# Patient Record
Sex: Female | Born: 1935 | Race: White | Hispanic: No | Smoking: Former smoker
Health system: Southern US, Community
[De-identification: ages and names within clinical notes are randomized; demographics above are authoritative.]

## PROBLEM LIST (undated history)

## (undated) DIAGNOSIS — E7800 Pure hypercholesterolemia, unspecified: Secondary | ICD-10-CM

## (undated) DIAGNOSIS — E039 Hypothyroidism, unspecified: Secondary | ICD-10-CM

## (undated) DIAGNOSIS — Q782 Osteopetrosis: Secondary | ICD-10-CM

## (undated) DIAGNOSIS — I7300 Raynaud's syndrome without gangrene: Secondary | ICD-10-CM

## (undated) DIAGNOSIS — T7840XA Allergy, unspecified, initial encounter: Secondary | ICD-10-CM

## (undated) DIAGNOSIS — D649 Anemia, unspecified: Secondary | ICD-10-CM

## (undated) DIAGNOSIS — E785 Hyperlipidemia, unspecified: Secondary | ICD-10-CM

## (undated) DIAGNOSIS — K219 Gastro-esophageal reflux disease without esophagitis: Secondary | ICD-10-CM

## (undated) DIAGNOSIS — Z8639 Personal history of other endocrine, nutritional and metabolic disease: Secondary | ICD-10-CM

## (undated) DIAGNOSIS — J449 Chronic obstructive pulmonary disease, unspecified: Secondary | ICD-10-CM

## (undated) DIAGNOSIS — I10 Essential (primary) hypertension: Secondary | ICD-10-CM

## (undated) DIAGNOSIS — M792 Neuralgia and neuritis, unspecified: Secondary | ICD-10-CM

## (undated) HISTORY — DX: Pure hypercholesterolemia, unspecified: E78.00

## (undated) HISTORY — DX: Hyperlipidemia, unspecified: E78.5

## (undated) HISTORY — DX: Hypothyroidism, unspecified: E03.9

## (undated) HISTORY — DX: Raynaud's syndrome without gangrene: I73.00

## (undated) HISTORY — DX: Gastro-esophageal reflux disease without esophagitis: K21.9

## (undated) HISTORY — PX: APPENDECTOMY: SHX54

## (undated) HISTORY — DX: Neuralgia and neuritis, unspecified: M79.2

## (undated) HISTORY — DX: Personal history of other endocrine, nutritional and metabolic disease: Z86.39

## (undated) HISTORY — DX: Essential (primary) hypertension: I10

## (undated) HISTORY — DX: Chronic obstructive pulmonary disease, unspecified: J44.9

## (undated) HISTORY — DX: Anemia, unspecified: D64.9

## (undated) HISTORY — DX: Allergy, unspecified, initial encounter: T78.40XA

## (undated) HISTORY — DX: Osteopetrosis: Q78.2

---

## 2011-07-04 LAB — LIPID PANEL
Cholesterol: 184 mg/dL (ref 0–200)
HDL: 64 mg/dL (ref 35–70)
LDL Cholesterol: 104 mg/dL
Triglycerides: 78 mg/dL (ref 40–160)

## 2011-07-04 LAB — CBC AND DIFFERENTIAL
HCT: 37 % (ref 36–46)
Hemoglobin: 12.6 g/dL (ref 12.0–16.0)
Platelets: 283 10*3/uL (ref 150–399)
WBC: 8.8 10^3/mL

## 2011-07-04 LAB — BASIC METABOLIC PANEL: BUN: 12 mg/dL (ref 4–21)

## 2011-07-04 LAB — HEPATIC FUNCTION PANEL: Alkaline Phosphatase: 55 U/L (ref 25–125)

## 2012-04-04 ENCOUNTER — Telehealth: Payer: Self-pay | Admitting: Internal Medicine

## 2012-04-04 MED ORDER — AMLODIPINE BESYLATE 2.5 MG PO TABS: 2.5000 mg | ORAL_TABLET | Freq: Every day | ORAL | Status: DC

## 2012-04-04 NOTE — Telephone Encounter (Signed)
rx sent to [facility] for amlodipine 2.5mg  (#30) with 3 refills

## 2012-04-20 ENCOUNTER — Other Ambulatory Visit: Payer: Self-pay | Admitting: *Deleted

## 2012-04-20 MED ORDER — FLUTICASONE PROPIONATE 50 MCG/ACT NA SUSP: 2.0000 | Freq: Every day | NASAL | Status: DC

## 2012-04-20 NOTE — Telephone Encounter (Signed)
Refilled flonase  

## 2012-04-27 ENCOUNTER — Telehealth: Payer: Self-pay | Admitting: Internal Medicine

## 2012-05-07 NOTE — Telephone Encounter (Signed)
Error

## 2012-06-12 ENCOUNTER — Encounter: Payer: Self-pay | Admitting: *Deleted

## 2012-06-15 ENCOUNTER — Ambulatory Visit: Payer: Self-pay | Admitting: Internal Medicine

## 2012-06-22 ENCOUNTER — Other Ambulatory Visit: Payer: Self-pay | Admitting: Internal Medicine

## 2012-06-22 NOTE — Telephone Encounter (Signed)
Eprescribed.

## 2012-07-10 ENCOUNTER — Other Ambulatory Visit: Payer: Self-pay | Admitting: Internal Medicine

## 2012-07-10 NOTE — Telephone Encounter (Signed)
Sent in to pharmacy.  

## 2012-07-22 ENCOUNTER — Other Ambulatory Visit: Payer: Self-pay | Admitting: *Deleted

## 2012-07-28 ENCOUNTER — Other Ambulatory Visit: Payer: Self-pay | Admitting: *Deleted

## 2012-07-29 ENCOUNTER — Encounter: Payer: Self-pay | Admitting: Internal Medicine

## 2012-07-29 MED ORDER — AMLODIPINE BESYLATE 2.5 MG PO TABS: ORAL_TABLET | ORAL | Status: DC

## 2012-07-29 NOTE — Telephone Encounter (Signed)
Sent in to pharmacy.  

## 2012-08-12 ENCOUNTER — Encounter: Payer: Self-pay | Admitting: Internal Medicine

## 2012-08-12 ENCOUNTER — Ambulatory Visit (INDEPENDENT_AMBULATORY_CARE_PROVIDER_SITE_OTHER): Payer: Medicare Other | Admitting: Internal Medicine

## 2012-08-12 VITALS — BP 140/70 | HR 67 | Temp 97.7°F | Ht 59.5 in | Wt 100.4 lb

## 2012-08-12 DIAGNOSIS — M810 Age-related osteoporosis without current pathological fracture: Secondary | ICD-10-CM | POA: Insufficient documentation

## 2012-08-12 DIAGNOSIS — E7800 Pure hypercholesterolemia, unspecified: Secondary | ICD-10-CM

## 2012-08-12 DIAGNOSIS — I10 Essential (primary) hypertension: Secondary | ICD-10-CM | POA: Insufficient documentation

## 2012-08-12 DIAGNOSIS — R350 Frequency of micturition: Secondary | ICD-10-CM

## 2012-08-12 DIAGNOSIS — E039 Hypothyroidism, unspecified: Secondary | ICD-10-CM

## 2012-08-12 DIAGNOSIS — R0989 Other specified symptoms and signs involving the circulatory and respiratory systems: Secondary | ICD-10-CM | POA: Insufficient documentation

## 2012-08-12 DIAGNOSIS — E871 Hypo-osmolality and hyponatremia: Secondary | ICD-10-CM

## 2012-08-12 DIAGNOSIS — D649 Anemia, unspecified: Secondary | ICD-10-CM

## 2012-08-12 LAB — URINALYSIS, ROUTINE W REFLEX MICROSCOPIC
Bilirubin Urine: NEGATIVE
Leukocytes, UA: NEGATIVE
Nitrite: NEGATIVE
Urobilinogen, UA: 0.2 (ref 0.0–1.0)
pH: 7.5 (ref 5.0–8.0)

## 2012-08-12 MED ORDER — ATORVASTATIN CALCIUM 20 MG PO TABS: 20.0000 mg | ORAL_TABLET | Freq: Every day | ORAL | Status: DC

## 2012-08-12 MED ORDER — LEVOTHYROXINE SODIUM 25 MCG PO TABS: 25.0000 ug | ORAL_TABLET | Freq: Every day | ORAL | Status: DC

## 2012-08-12 MED ORDER — AMLODIPINE BESYLATE 2.5 MG PO TABS: ORAL_TABLET | ORAL | Status: DC

## 2012-08-12 MED ORDER — FLUTICASONE PROPIONATE 50 MCG/ACT NA SUSP: 2.0000 | Freq: Every day | NASAL | Status: DC

## 2012-08-12 NOTE — Assessment & Plan Note (Signed)
Recheck sodium to confirm stable.  

## 2012-08-12 NOTE — Assessment & Plan Note (Signed)
Blood pressure as outlined.  States it runs in the 130s at home.  Same medication regimen.  Follow.  Check metabolic panel.

## 2012-08-12 NOTE — Progress Notes (Signed)
Subjective:    Patient ID: Bailey Sanchez, female    DOB: 1925-11-17, 79 y.o.   MRN: [id number]  HPI 79 year old female with past history of hypertension, hypercholesterolemia, hypothyroidism and SIADH who comes in today for a scheduled follow up and to transfer her care here to [facility].  States she has been doing relatively well.  No chest pain or tightness.  Tries to stay active.  No nausea or vomiting.  No bowel change.  Had a bladder infection in the fall 2013.  Took AZO.  Symptoms improved.  States since that time has noticed some increased urinary frequency and nocturia.  Blood pressure has been doing well at home.  States averaging 130s systolic.  Weight is stable.  Bowels stable.    Past Medical History  Diagnosis Date  . Hyperlipidemia   . Neuralgia and neuritis   . Hypertension   . Anemia   . GERD (gastroesophageal reflux disease)   . Hypercholesterolemia   . Allergy   . Raynauds phenomenon     documented hx  . Osteopetrosis   . COPD (chronic obstructive pulmonary disease)     documented hx of mild case  . History of SIADH   . Hypothyroidism     Current Outpatient Prescriptions on File Prior to Visit  Medication Sig Dispense Refill  . Ascorbic Acid (VITAMIN C) 1000 MG tablet Take 1,000 mg by mouth daily.      Marland Kitchen aspirin EC 81 MG tablet Take 81 mg by mouth daily.      . Calcium-Vitamin D (CALTRATE 600 PLUS-VIT D PO) Take 1 tablet by mouth daily.      . cholecalciferol (VITAMIN D) 400 UNITS TABS Take 400 Units by mouth daily.      . cyanocobalamin 1000 MCG tablet Take 1,000 mcg by mouth daily.      . Multiple Vitamins-Minerals (CENTRUM SILVER PO) Take by mouth.      . vitamin E (VITAMIN E) 400 UNIT capsule Take 400 Units by mouth daily.       No current facility-administered medications on file prior to visit.    Review of Systems Patient denies any headache, lightheadedness or dizziness.  No sinus or allergy symptoms.  No chest pain, tightness or palpitations.  No  increased shortness of breath, cough or congestion.  No nausea or vomiting.  No acid reflux.  No abdominal pain or cramping.  No bowel change, such as diarrhea, constipation, BRBPR or melana.  Previous urinary tract infection as outlined.  Since has had increased urinary frequency and nocturia.        Objective:   Physical Exam Filed Vitals:   08/12/12 1006  BP: 140/70  Pulse: 67  Temp: 97.7 F (33.57 C)   80 year old female in no acute distress.   HEENT:  Nares- clear.  Oropharynx - without lesions. NECK:  Supple.  Nontender.  No audible bruit.  HEART:  Appears to be regular. LUNGS:  No crackles or wheezing audible.  Respirations even and unlabored.  RADIAL PULSE:  Equal bilaterally.   ABDOMEN:  Soft, nontender.  Bowel sounds present and normal.  Audible abdominal bruit.    EXTREMITIES:  No increased edema present.  DP pulses palpable and equal bilaterally.           Assessment & Plan:  PULMONARY.  Breathing stable.  Follow.   GI.  States she is eating well.  Weight is stable at 100 pounds.  Previous abdominal ultrasound 09/20/08- no acute abnormality.  Common bile duct - slightly prominent but they felt it was normal for her age.  Have previously talked with her regarding further w/up.  She has declined.  Declines colon evaluation.    HEALTH MAINTENANCE.  Physical 10/09/11.  She declines further GI evaluation.  Mammogram 07/20/10 - BiRADS II.  She has requested not to schedule any more mammograms.

## 2012-08-12 NOTE — Assessment & Plan Note (Signed)
On thyroid replacement.  Check tsh.  

## 2012-08-12 NOTE — Assessment & Plan Note (Signed)
Last bone density 12/04/10 revealed osteoporosis with no significant change.  Off Fosamax.  Last vitamin D level wnl.  Continue calcium, vitamin D and weight bearing exercise.     

## 2012-08-12 NOTE — Assessment & Plan Note (Signed)
Remains on Lipitor.  Check lipid panel and liver function with next fasting labs.    

## 2012-08-12 NOTE — Assessment & Plan Note (Signed)
Check cbc to confirm stable.   

## 2012-08-12 NOTE — Assessment & Plan Note (Signed)
Previous ultrasound revealed to aorta to have atherosclerotic irregularity.  No mention of aneurysm.  Has declined further w/up.  Follow.   

## 2012-08-14 LAB — CULTURE, URINE COMPREHENSIVE
Colony Count: NO GROWTH
Organism ID, Bacteria: NO GROWTH

## 2012-10-14 ENCOUNTER — Encounter: Payer: Self-pay | Admitting: Internal Medicine

## 2012-10-14 ENCOUNTER — Ambulatory Visit (INDEPENDENT_AMBULATORY_CARE_PROVIDER_SITE_OTHER): Payer: Medicare Other | Admitting: Internal Medicine

## 2012-10-14 VITALS — BP 120/70 | HR 65 | Temp 97.5°F | Ht 60.0 in | Wt 99.8 lb

## 2012-10-14 DIAGNOSIS — R0989 Other specified symptoms and signs involving the circulatory and respiratory systems: Secondary | ICD-10-CM

## 2012-10-14 DIAGNOSIS — E039 Hypothyroidism, unspecified: Secondary | ICD-10-CM

## 2012-10-14 DIAGNOSIS — I10 Essential (primary) hypertension: Secondary | ICD-10-CM

## 2012-10-14 DIAGNOSIS — M810 Age-related osteoporosis without current pathological fracture: Secondary | ICD-10-CM

## 2012-10-14 DIAGNOSIS — E871 Hypo-osmolality and hyponatremia: Secondary | ICD-10-CM

## 2012-10-14 DIAGNOSIS — D649 Anemia, unspecified: Secondary | ICD-10-CM

## 2012-10-14 DIAGNOSIS — R350 Frequency of micturition: Secondary | ICD-10-CM

## 2012-10-14 DIAGNOSIS — E7800 Pure hypercholesterolemia, unspecified: Secondary | ICD-10-CM

## 2012-10-18 ENCOUNTER — Encounter: Payer: Self-pay | Admitting: Internal Medicine

## 2012-10-18 NOTE — Assessment & Plan Note (Signed)
Follow sodium to confirm stable.  

## 2012-10-18 NOTE — Assessment & Plan Note (Signed)
On thyroid replacement.  Follow tsh.  

## 2012-10-18 NOTE — Assessment & Plan Note (Signed)
Last bone density 12/04/10 revealed osteoporosis with no significant change.  Off Fosamax.  Last vitamin D level wnl.  Continue calcium, vitamin D and weight bearing exercise.

## 2012-10-18 NOTE — Assessment & Plan Note (Signed)
Follow cbc to confirm stable.  

## 2012-10-18 NOTE — Progress Notes (Signed)
Subjective:    Patient ID: Bailey Sanchez, female    DOB: [birth year], 79 y.o.   MRN: [id number]  HPI 79 year old female with past history of hypertension, hypercholesterolemia, hypothyroidism and SIADH who comes in today to follow up on these issues as well as for a complete physical exam. States she has been doing relatively well.  No chest pain or tightness.  Tries to stay active.  No nausea or vomiting.  No bowel change.  Blood pressure has been doing well at home.  Weight is relatively stable.  Bowels stable.  Some increased urinary frequency.     Past Medical History  Diagnosis Date  . Hyperlipidemia   . Neuralgia and neuritis   . Hypertension   . Anemia   . GERD (gastroesophageal reflux disease)   . Hypercholesterolemia   . Allergy   . Raynauds phenomenon     documented hx  . Osteopetrosis   . COPD (chronic obstructive pulmonary disease)     documented hx of mild case  . History of SIADH   . Hypothyroidism     Current Outpatient Prescriptions on File Prior to Visit  Medication Sig Dispense Refill  . amLODipine (NORVASC) 2.5 MG tablet TAKE ONE (1) TABLET EACH DAY  90 tablet  1  . Ascorbic Acid (VITAMIN C) 1000 MG tablet Take 1,000 mg by mouth daily.      Marland Kitchen aspirin EC 81 MG tablet Take 81 mg by mouth daily.      Marland Kitchen atorvastatin (LIPITOR) 20 MG tablet Take 1 tablet (20 mg total) by mouth daily.  90 tablet  1  . Calcium-Vitamin D (CALTRATE 600 PLUS-VIT D PO) Take 1 tablet by mouth daily.      . cholecalciferol (VITAMIN D) 400 UNITS TABS Take 400 Units by mouth daily.      . cyanocobalamin 1000 MCG tablet Take 1,000 mcg by mouth daily.      . fluticasone (FLONASE) 50 MCG/ACT nasal spray Place 2 sprays into the nose daily.  16 g  6  . levothyroxine (SYNTHROID, LEVOTHROID) 25 MCG tablet Take 1 tablet (25 mcg total) by mouth daily.  90 tablet  1  . Multiple Vitamins-Minerals (CENTRUM SILVER PO) Take by mouth.      . vitamin E (VITAMIN E) 400 UNIT capsule Take 400 Units by mouth  daily.       No current facility-administered medications on file prior to visit.    Review of Systems Patient denies any headache, lightheadedness or dizziness.  No sinus or allergy symptoms.  No chest pain, tightness or palpitations.  No increased shortness of breath, cough or congestion.  No nausea or vomiting.  No acid reflux.  No abdominal pain or cramping.  No bowel change, such as diarrhea, constipation, BRBPR or melana.  Since has had increased urinary frequency and nocturia. Discussed treatment options.  She desires no medication at this time.  Discussed not drinking before bed, emptying bladder regularly, etc.  She feels she is not emptying her bladder fully.         Objective:   Physical Exam  Filed Vitals:   10/14/12 0945  BP: 120/70  Pulse: 65  Temp: 97.5 F (43.70 C)   79 year old female in no acute distress.   HEENT:  Nares- clear.  Oropharynx - without lesions. NECK:  Supple.  Nontender.  No audible bruit.  HEART:  Appears to be regular. LUNGS:  No crackles or wheezing audible.  Respirations even and unlabored.  RADIAL  PULSE:  Equal bilaterally.    BREASTS:  No nipple discharge or nipple retraction present.  Could not appreciate any distinct nodules or axillary adenopathy.  ABDOMEN:  Soft, nontender.  Bowel sounds present and normal.  No audible abdominal bruit.  GU:  She declined.    RECTAL:  She declined.   EXTREMITIES:  No increased edema present.  DP pulses palpable and equal bilaterally.          Assessment & Plan:  GU.  She feels she is not emptying her bladder fully.  Discussed treatment options with her.  After discussion, it was decided to refer her to urology for evaluation and further treatment.    PULMONARY.  Breathing stable.  Follow.   GI.  States she is eating well.  Weight is stable at 100 pounds.  Previous abdominal ultrasound 09/20/08- no acute abnormality.  Common bile duct - slightly prominent but they felt it was normal for her age.  Have  previously talked with her regarding further w/up.  She has declined.  Declines colon evaluation.    HEALTH MAINTENANCE.  Physical today.  Declined rectal and pelvic exam.  She declines further GI evaluation.  Mammogram 07/20/10 - BiRADS II.  She has requested not to schedule any more mammograms.

## 2012-10-18 NOTE — Assessment & Plan Note (Signed)
Blood pressure as outlined.  Same medication regimen.  Follow.   

## 2012-10-18 NOTE — Assessment & Plan Note (Signed)
Remains on Lipitor.  Check lipid panel and liver function with next fasting labs.

## 2012-10-18 NOTE — Assessment & Plan Note (Signed)
Previous ultrasound revealed to aorta to have atherosclerotic irregularity.  No mention of aneurysm.  Has declined further w/up.  Follow.

## 2012-10-23 ENCOUNTER — Other Ambulatory Visit (INDEPENDENT_AMBULATORY_CARE_PROVIDER_SITE_OTHER): Payer: Medicare Other

## 2012-10-23 DIAGNOSIS — E039 Hypothyroidism, unspecified: Secondary | ICD-10-CM

## 2012-10-23 DIAGNOSIS — I10 Essential (primary) hypertension: Secondary | ICD-10-CM

## 2012-10-23 DIAGNOSIS — D649 Anemia, unspecified: Secondary | ICD-10-CM

## 2012-10-23 DIAGNOSIS — E7800 Pure hypercholesterolemia, unspecified: Secondary | ICD-10-CM

## 2012-10-23 LAB — CBC WITH DIFFERENTIAL/PLATELET
Basophils Absolute: 0.1 10*3/uL (ref 0.0–0.1)
Eosinophils Absolute: 0.4 10*3/uL (ref 0.0–0.7)
HCT: 37.9 % (ref 36.0–46.0)
Hemoglobin: 12.8 g/dL (ref 12.0–15.0)
Lymphs Abs: 4.1 10*3/uL — ABNORMAL HIGH (ref 0.7–4.0)
MCHC: 33.7 g/dL (ref 30.0–36.0)
MCV: 90.2 fl (ref 78.0–100.0)
Monocytes Absolute: 0.6 10*3/uL (ref 0.1–1.0)
Monocytes Relative: 6.9 % (ref 3.0–12.0)
Neutro Abs: 3.1 10*3/uL (ref 1.4–7.7)
Platelets: 221 10*3/uL (ref 150.0–400.0)
RDW: 13.7 % (ref 11.5–14.6)

## 2012-10-23 LAB — TSH: TSH: 2.82 u[IU]/mL (ref 0.35–5.50)

## 2012-10-23 LAB — HEPATIC FUNCTION PANEL
ALT: 23 U/L (ref 0–35)
AST: 30 U/L (ref 0–37)
Albumin: 3.8 g/dL (ref 3.5–5.2)
Alkaline Phosphatase: 48 U/L (ref 39–117)
Total Protein: 6.4 g/dL (ref 6.0–8.3)

## 2012-10-23 LAB — LIPID PANEL: HDL: 92.6 mg/dL (ref >39.00)

## 2012-10-23 LAB — BASIC METABOLIC PANEL
Chloride: 107 mEq/L (ref 96–112)
GFR: 60.09 mL/min (ref >60.00)
Glucose, Bld: 85 mg/dL (ref 70–99)
Potassium: 4.9 mEq/L (ref 3.5–5.1)
Sodium: 141 mEq/L (ref 135–145)

## 2012-10-23 LAB — LDL CHOLESTEROL, DIRECT: Direct LDL: 94.7 mg/dL

## 2012-10-26 ENCOUNTER — Encounter: Payer: Self-pay | Admitting: *Deleted

## 2013-02-17 ENCOUNTER — Ambulatory Visit (INDEPENDENT_AMBULATORY_CARE_PROVIDER_SITE_OTHER): Payer: Medicare Other

## 2013-02-17 DIAGNOSIS — Z23 Encounter for immunization: Secondary | ICD-10-CM

## 2013-02-18 ENCOUNTER — Other Ambulatory Visit: Payer: Self-pay | Admitting: Internal Medicine

## 2013-03-04 ENCOUNTER — Other Ambulatory Visit: Payer: Self-pay | Admitting: Internal Medicine

## 2013-04-19 ENCOUNTER — Ambulatory Visit (INDEPENDENT_AMBULATORY_CARE_PROVIDER_SITE_OTHER): Payer: Medicare Other | Admitting: Internal Medicine

## 2013-04-19 ENCOUNTER — Encounter: Payer: Self-pay | Admitting: Internal Medicine

## 2013-04-19 ENCOUNTER — Encounter (INDEPENDENT_AMBULATORY_CARE_PROVIDER_SITE_OTHER): Payer: Self-pay

## 2013-04-19 VITALS — BP 110/70 | HR 62 | Temp 97.6°F | Ht 60.0 in | Wt 101.5 lb

## 2013-04-19 DIAGNOSIS — M810 Age-related osteoporosis without current pathological fracture: Secondary | ICD-10-CM

## 2013-04-19 DIAGNOSIS — E7800 Pure hypercholesterolemia, unspecified: Secondary | ICD-10-CM

## 2013-04-19 DIAGNOSIS — E871 Hypo-osmolality and hyponatremia: Secondary | ICD-10-CM

## 2013-04-19 DIAGNOSIS — D649 Anemia, unspecified: Secondary | ICD-10-CM

## 2013-04-19 DIAGNOSIS — R0989 Other specified symptoms and signs involving the circulatory and respiratory systems: Secondary | ICD-10-CM

## 2013-04-19 DIAGNOSIS — E039 Hypothyroidism, unspecified: Secondary | ICD-10-CM

## 2013-04-19 DIAGNOSIS — I10 Essential (primary) hypertension: Secondary | ICD-10-CM

## 2013-04-19 NOTE — Assessment & Plan Note (Signed)
Follow sodium to confirm stable.  

## 2013-04-19 NOTE — Assessment & Plan Note (Signed)
On thyroid replacement.  Follow tsh.  

## 2013-04-19 NOTE — Assessment & Plan Note (Addendum)
Last bone density 12/04/10 revealed osteoporosis with no significant change.  Off Fosamax.  Last vitamin D level wnl.  Continue vitamin D and weight bearing exercise.

## 2013-04-19 NOTE — Progress Notes (Signed)
Subjective:    Patient ID: Bailey Sanchez, female    DOB: Jun 23, 1925, 79 y.o.   MRN: [id number]  HPI 79 year old female with past history of hypertension, hypercholesterolemia, hypothyroidism and SIADH who comes in today for a scheduled follow up.   States she has been doing relatively well.  No chest pain or tightness.  Tries to stay active.  No nausea or vomiting.  No bowel change.  Blood pressure has been doing well at home.  Weight is stable.  Bowels stable.  Some increased urinary frequency.  Saw Michiel Cowboy.  On Myrbetriq.  Helping some.  Planning to f/u with urology.  Some right shoulder pain.  Had an injection.  Helped some.  Takes tylenol.  Helps.     Past Medical History  Diagnosis Date  . Hyperlipidemia   . Neuralgia and neuritis   . Hypertension   . Anemia   . GERD (gastroesophageal reflux disease)   . Hypercholesterolemia   . Allergy   . Raynauds phenomenon     documented hx  . Osteopetrosis   . COPD (chronic obstructive pulmonary disease)     documented hx of mild case  . History of SIADH   . Hypothyroidism     Current Outpatient Prescriptions on File Prior to Visit  Medication Sig Dispense Refill  . amLODipine (NORVASC) 2.5 MG tablet TAKE ONE (1) TABLET EACH DAY  90 tablet  1  . Ascorbic Acid (VITAMIN C) 1000 MG tablet Take 1,000 mg by mouth daily.      Marland Kitchen aspirin EC 81 MG tablet Take 81 mg by mouth daily.      Marland Kitchen atorvastatin (LIPITOR) 20 MG tablet TAKE ONE (1) TABLET EACH DAY  90 tablet  1  . Calcium-Vitamin D (CALTRATE 600 PLUS-VIT D PO) Take 1 tablet by mouth daily.      . cholecalciferol (VITAMIN D) 400 UNITS TABS Take 400 Units by mouth daily.      . cyanocobalamin 1000 MCG tablet Take 1,000 mcg by mouth daily.      . fluticasone (FLONASE) 50 MCG/ACT nasal spray Place 2 sprays into the nose daily.  16 g  6  . levothyroxine (SYNTHROID, LEVOTHROID) 25 MCG tablet TAKE ONE (1) TABLET EACH DAY  90 tablet  2  . Multiple Vitamins-Minerals (CENTRUM SILVER PO) Take by  mouth.      . Multiple Vitamins-Minerals (PRESERVISION/LUTEIN) CAPS Take by mouth 2 (two) times daily.      . vitamin E (VITAMIN E) 400 UNIT capsule Take 400 Units by mouth daily.       No current facility-administered medications on file prior to visit.    Review of Systems Patient denies any headache, lightheadedness or dizziness.  No sinus or allergy symptoms.  No chest pain, tightness or palpitations.  No increased shortness of breath, cough or congestion.  No nausea or vomiting.  No acid reflux.  No abdominal pain or cramping.  No bowel change, such as diarrhea, constipation, BRBPR or melana.  On Myrbetriq now.  Helping her bladder symptoms some.  Continues to f/u with urology.  Had shoulder injection.  Helped some.  Takes tylenol.  Helps.          Objective:   Physical Exam  Filed Vitals:   04/19/13 0751  BP: 110/70  Pulse: 62  Temp: 97.6 F (76.33 C)   79 year old female in no acute distress.   HEENT:  Nares- clear.  Oropharynx - without lesions. NECK:  Supple.  Nontender.  No audible bruit.  HEART:  Appears to be regular. LUNGS:  No crackles or wheezing audible.  Respirations even and unlabored.  RADIAL PULSE:  Equal bilaterally.  ABDOMEN:  Soft, nontender.  Bowel sounds present and normal.  No audible abdominal bruit. Marland Kitchen   EXTREMITIES:  No increased edema present.  DP pulses palpable and equal bilaterally.   MSK:  Some minimal discomfort with full extension right arm.          Assessment & Plan:  PULMONARY.  Breathing stable.  Follow.   GI.  States she is eating well.  Weight is stable at 101 pounds.  Previous abdominal ultrasound 09/20/08- no acute abnormality.  Common bile duct - slightly prominent but they felt it was normal for her age.  Have previously talked with her regarding further w/up.  She has declined.  Declines colon evaluation.    HEALTH MAINTENANCE.  Physical 10/14/12.   Declined rectal and pelvic exam.  She declines further GI evaluation.  Mammogram 07/20/10 -  BiRADS II.  She has requested not to schedule any more mammograms.

## 2013-04-19 NOTE — Progress Notes (Signed)
Pre-visit discussion using our clinic review tool. No additional management support is needed unless otherwise documented below in the visit note.  

## 2013-04-20 ENCOUNTER — Encounter: Payer: Self-pay | Admitting: Internal Medicine

## 2013-04-20 NOTE — Assessment & Plan Note (Signed)
Previous ultrasound revealed to aorta to have atherosclerotic irregularity.  No mention of aneurysm.  Has declined further w/up.  Follow.

## 2013-04-20 NOTE — Assessment & Plan Note (Signed)
Follow cbc to confirm stable.  

## 2013-04-20 NOTE — Assessment & Plan Note (Signed)
Remains on Lipitor.  Check lipid panel and liver function with next fasting labs.

## 2013-04-20 NOTE — Assessment & Plan Note (Signed)
Blood pressure as outlined.  Same medication regimen.  Follow.   

## 2013-07-27 ENCOUNTER — Telehealth: Payer: Self-pay | Admitting: Internal Medicine

## 2013-07-27 NOTE — Telephone Encounter (Signed)
Called pt back & offered her the 8:30 appt tomorrow-pt accepted it

## 2013-07-27 NOTE — Telephone Encounter (Signed)
Pt states that sx's started yesterday. Still dizzy & nauseated, but not vomiting anymore. Feels like she has a fever, & she has some body aches. Hasn't been around anyone sick that she knows of. Doing okay keeping fluids down. She is weak & doesn't want to leave the house. Pt would like to have something called in to pharmacy. Please advise

## 2013-07-27 NOTE — Telephone Encounter (Signed)
If acute dizziness and nausea - then needs evaluation.  I would recommend evaluation today.

## 2013-07-27 NOTE — Telephone Encounter (Signed)
Pt.notified

## 2013-07-27 NOTE — Telephone Encounter (Signed)
Cumi/Patient  DOB: Jan 13, 1926 PCP Einar Pheasant.  80 yo called regarding vomiting with dizziness.  No contact at time of call back approximately 1316.  Left message to call if assistance still needed.

## 2013-07-28 ENCOUNTER — Encounter (INDEPENDENT_AMBULATORY_CARE_PROVIDER_SITE_OTHER): Payer: Self-pay

## 2013-07-28 ENCOUNTER — Other Ambulatory Visit: Payer: Self-pay | Admitting: Emergency Medicine

## 2013-07-28 ENCOUNTER — Ambulatory Visit (INDEPENDENT_AMBULATORY_CARE_PROVIDER_SITE_OTHER): Payer: Medicare Other | Admitting: Internal Medicine

## 2013-07-28 ENCOUNTER — Encounter: Payer: Self-pay | Admitting: Internal Medicine

## 2013-07-28 VITALS — BP 110/80 | HR 55 | Temp 97.6°F | Ht 60.0 in | Wt 99.5 lb

## 2013-07-28 DIAGNOSIS — I10 Essential (primary) hypertension: Secondary | ICD-10-CM

## 2013-07-28 DIAGNOSIS — E871 Hypo-osmolality and hyponatremia: Secondary | ICD-10-CM

## 2013-07-28 DIAGNOSIS — R42 Dizziness and giddiness: Secondary | ICD-10-CM

## 2013-07-28 DIAGNOSIS — D649 Anemia, unspecified: Secondary | ICD-10-CM

## 2013-07-28 NOTE — Progress Notes (Signed)
Pre-visit discussion using our clinic review tool. No additional management support is needed unless otherwise documented below in the visit note.  

## 2013-08-01 ENCOUNTER — Encounter: Payer: Self-pay | Admitting: Internal Medicine

## 2013-08-01 DIAGNOSIS — R42 Dizziness and giddiness: Secondary | ICD-10-CM | POA: Insufficient documentation

## 2013-08-01 NOTE — Progress Notes (Signed)
Subjective:    Patient ID: Bailey Sanchez, female    DOB: [birth year], 80 y.o.   MRN: [id number]  Dizziness Associated symptoms include coughing.  Cough  80 year old female with past history of hypertension, hypercholesterolemia, hypothyroidism and SIADH who comes in today as a work in with concerns regarding weakness and dizziness.  States symptoms started approximately five days ago.  Felt dizzy.  Worse the following day.  Has had minimal cough.  Some emesis.  Feels sick after eating.  No diarrhea.  No chest pain or tightness.  Just feels very weak.  Symptoms have persisted, so in today for evaluation.     Past Medical History  Diagnosis Date  . Hyperlipidemia   . Neuralgia and neuritis   . Hypertension   . Anemia   . GERD (gastroesophageal reflux disease)   . Hypercholesterolemia   . Allergy   . Raynauds phenomenon     documented hx  . Osteopetrosis   . COPD (chronic obstructive pulmonary disease)     documented hx of mild case  . History of SIADH   . Hypothyroidism     Current Outpatient Prescriptions on File Prior to Visit  Medication Sig Dispense Refill  . amLODipine (NORVASC) 2.5 MG tablet TAKE ONE (1) TABLET EACH DAY  90 tablet  1  . Ascorbic Acid (VITAMIN C) 1000 MG tablet Take 1,000 mg by mouth daily.      Marland Kitchen aspirin EC 81 MG tablet Take 81 mg by mouth daily.      Marland Kitchen atorvastatin (LIPITOR) 20 MG tablet TAKE ONE (1) TABLET EACH DAY  90 tablet  1  . Calcium-Vitamin D (CALTRATE 600 PLUS-VIT D PO) Take 1 tablet by mouth daily.      . cholecalciferol (VITAMIN D) 400 UNITS TABS Take 400 Units by mouth daily.      . cyanocobalamin 1000 MCG tablet Take 1,000 mcg by mouth daily.      . fluticasone (FLONASE) 50 MCG/ACT nasal spray Place 2 sprays into the nose daily.  16 g  6  . levothyroxine (SYNTHROID, LEVOTHROID) 25 MCG tablet TAKE ONE (1) TABLET EACH DAY  90 tablet  2  . mirabegron ER (MYRBETRIQ) 25 MG TB24 tablet Take 25 mg by mouth daily.      . Multiple Vitamins-Minerals  (CENTRUM SILVER PO) Take by mouth.      . Multiple Vitamins-Minerals (PRESERVISION/LUTEIN) CAPS Take by mouth 2 (two) times daily.      . vitamin E (VITAMIN E) 400 UNIT capsule Take 400 Units by mouth daily.       No current facility-administered medications on file prior to visit.    Review of Systems  Respiratory: Positive for cough.   Neurological: Positive for dizziness.  Patient denies any headache.  Dizziness as outlined.   No sinus or allergy symptoms.  No chest pain, tightness or palpitations.  No increased shortness of breath, cough or congestion.  No nausea or vomiting.  No acid reflux.  No abdominal pain or cramping.  No bowel change, such as diarrhea, constipation, BRBPR or melana.  Weakness as outlined.            Objective:   Physical Exam  Filed Vitals:   07/28/13 0830  BP: 110/80  Pulse: 55  Temp: 97.6 F (25.74 C)   80 year old female who appears not to feel well.     HEENT:  Nares- clear.  Oropharynx - without lesions. NECK:  Supple.  Nontender.  No audible bruit.  HEART:  Appears to be regular. LUNGS:  No crackles or wheezing audible.  Respirations even and unlabored.  RADIAL PULSE:  Equal bilaterally.  ABDOMEN:  Soft, nontender.  Bowel sounds present and normal.  No audible abdominal bruit. Marland Kitchen   EXTREMITIES:  No increased edema present.  DP pulses palpable and equal bilaterally.         Assessment & Plan:  PULMONARY.  Breathing stable.  Follow.   GI.  Previous abdominal ultrasound 09/20/08- no acute abnormality.  Common bile duct - slightly prominent but they felt it was normal for her age.  Have previously talked with her regarding further w/up.  She has declined.  Declines colon evaluation.  Now with emesis associated with the dizziness.  Decreased appetite.    HEALTH MAINTENANCE.  Physical 10/14/12.   Mammogram 07/20/10 - BiRADS II.  She has requested not to schedule any more mammograms.   I spent 25 minutes with the patient with more than 50% of the time spent  in consultation regarding the above.

## 2013-08-01 NOTE — Assessment & Plan Note (Signed)
Orthostatic on exam.  Blood pressure after minimal standing - 70s.  Weak.  Will need to send over to the ER for further evaluation and treatment.  Pt, her sister and her niece notified and in agreement for referral to ER.  She prefers her sister to transport her.

## 2013-08-01 NOTE — Assessment & Plan Note (Signed)
Need to check cbc to confirm stable.

## 2013-08-01 NOTE — Assessment & Plan Note (Signed)
Persistent.  Unclear etiology.  Needs further evaluation and w/up.  Will refer to ER as outlined.

## 2013-08-01 NOTE — Assessment & Plan Note (Signed)
With current symptoms and decreased appetite, concern that her sodium may be low.  Refer to ER for further testing, evaluation and treatment.

## 2013-08-20 ENCOUNTER — Other Ambulatory Visit: Payer: Self-pay | Admitting: Internal Medicine

## 2013-08-31 ENCOUNTER — Other Ambulatory Visit: Payer: Self-pay | Admitting: Internal Medicine

## 2013-09-07 ENCOUNTER — Other Ambulatory Visit: Payer: Self-pay | Admitting: Internal Medicine

## 2013-10-11 ENCOUNTER — Telehealth: Payer: Self-pay | Admitting: *Deleted

## 2013-10-11 NOTE — Telephone Encounter (Signed)
I cannot work her in at 8:00 in the am, but I can work her in at lunch tomorrow - approximately 12:00.  She may have to wait.  Being worked in.

## 2013-10-11 NOTE — Telephone Encounter (Signed)
Niece notified & states that she will still come in @ 8 for fasting labs & will return for appt at 12:00. I have requested records from [facility] & Dr. Bernardo Heater.

## 2013-10-11 NOTE — Telephone Encounter (Signed)
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 She is still recovering from the surgery & she is very weak & nauseated. Dr. Bernardo Heater sent in a Rx today for her nausea. She is still having trouble with restless leg. She was given a Requip in the hospital for RLS & it seemed helpful. She also mentioned that pt is coming in for labs tomorrow & was wondering if she could be seen around the same time or just have something sent to pharmacy. Please advise.

## 2013-10-12 ENCOUNTER — Other Ambulatory Visit (INDEPENDENT_AMBULATORY_CARE_PROVIDER_SITE_OTHER): Payer: Medicare Other

## 2013-10-12 ENCOUNTER — Encounter (INDEPENDENT_AMBULATORY_CARE_PROVIDER_SITE_OTHER): Payer: Self-pay

## 2013-10-12 ENCOUNTER — Encounter: Payer: Self-pay | Admitting: Internal Medicine

## 2013-10-12 ENCOUNTER — Other Ambulatory Visit: Payer: Self-pay | Admitting: Internal Medicine

## 2013-10-12 ENCOUNTER — Other Ambulatory Visit: Payer: Self-pay | Admitting: Emergency Medicine

## 2013-10-12 ENCOUNTER — Ambulatory Visit (INDEPENDENT_AMBULATORY_CARE_PROVIDER_SITE_OTHER): Payer: Medicare Other | Admitting: Internal Medicine

## 2013-10-12 VITALS — BP 118/52 | HR 62 | Resp 16 | Ht 60.0 in | Wt 95.5 lb

## 2013-10-12 DIAGNOSIS — M810 Age-related osteoporosis without current pathological fracture: Secondary | ICD-10-CM

## 2013-10-12 DIAGNOSIS — D649 Anemia, unspecified: Secondary | ICD-10-CM

## 2013-10-12 DIAGNOSIS — E7800 Pure hypercholesterolemia, unspecified: Secondary | ICD-10-CM

## 2013-10-12 DIAGNOSIS — E039 Hypothyroidism, unspecified: Secondary | ICD-10-CM

## 2013-10-12 DIAGNOSIS — E871 Hypo-osmolality and hyponatremia: Secondary | ICD-10-CM

## 2013-10-12 DIAGNOSIS — I10 Essential (primary) hypertension: Secondary | ICD-10-CM

## 2013-10-12 DIAGNOSIS — G2581 Restless legs syndrome: Secondary | ICD-10-CM

## 2013-10-12 DIAGNOSIS — R110 Nausea: Secondary | ICD-10-CM

## 2013-10-12 DIAGNOSIS — C679 Malignant neoplasm of bladder, unspecified: Secondary | ICD-10-CM

## 2013-10-12 LAB — BASIC METABOLIC PANEL
BUN: 7 mg/dL (ref 6–23)
CHLORIDE: 87 meq/L — AB (ref 96–112)
CO2: 23 mEq/L (ref 19–32)
Calcium: 9.6 mg/dL (ref 8.4–10.5)
Creatinine, Ser: 0.7 mg/dL (ref 0.4–1.2)
GFR: 88.61 mL/min (ref >60.00)
GLUCOSE: 84 mg/dL (ref 70–99)
POTASSIUM: 4.4 meq/L (ref 3.5–5.1)
SODIUM: 121 meq/L — AB (ref 135–145)

## 2013-10-12 LAB — LIPID PANEL
CHOLESTEROL: 175 mg/dL (ref 0–200)
HDL: 87.7 mg/dL (ref >39.00)
LDL Cholesterol: 77 mg/dL (ref 0–99)
TRIGLYCERIDES: 54 mg/dL (ref 0.0–149.0)
Total CHOL/HDL Ratio: 2
VLDL: 10.8 mg/dL (ref 0.0–40.0)

## 2013-10-12 LAB — HEPATIC FUNCTION PANEL
ALBUMIN: 3.6 g/dL (ref 3.5–5.2)
ALT: 21 U/L (ref 0–35)
AST: 35 U/L (ref 0–37)
Alkaline Phosphatase: 72 U/L (ref 39–117)
BILIRUBIN DIRECT: 0.2 mg/dL (ref 0.0–0.3)
TOTAL PROTEIN: 5.8 g/dL — AB (ref 6.0–8.3)
Total Bilirubin: 0.6 mg/dL (ref 0.2–1.2)

## 2013-10-12 LAB — CBC WITH DIFFERENTIAL/PLATELET
BASOS PCT: 0.4 % (ref 0.0–3.0)
Basophils Absolute: 0 10*3/uL (ref 0.0–0.1)
Eosinophils Absolute: 0.3 10*3/uL (ref 0.0–0.7)
Eosinophils Relative: 4.7 % (ref 0.0–5.0)
HCT: 31.1 % — ABNORMAL LOW (ref 36.0–46.0)
Hemoglobin: 10.4 g/dL — ABNORMAL LOW (ref 12.0–15.0)
LYMPHS PCT: 36 % (ref 12.0–46.0)
Lymphs Abs: 2.2 10*3/uL (ref 0.7–4.0)
MCHC: 33.5 g/dL (ref 30.0–36.0)
MCV: 86.2 fl (ref 78.0–100.0)
MONOS PCT: 12.2 % — AB (ref 3.0–12.0)
Monocytes Absolute: 0.8 10*3/uL (ref 0.1–1.0)
NEUTROS ABS: 2.9 10*3/uL (ref 1.4–7.7)
NEUTROS PCT: 46.7 % (ref 43.0–77.0)
Platelets: 385 10*3/uL (ref 150.0–400.0)
RBC: 3.6 Mil/uL — AB (ref 3.87–5.11)
RDW: 14.6 % (ref 11.5–15.5)
WBC: 6.2 10*3/uL (ref 4.0–10.5)

## 2013-10-12 LAB — TSH: TSH: 4.8 u[IU]/mL — ABNORMAL HIGH (ref 0.35–4.50)

## 2013-10-12 MED ORDER — PRAMIPEXOLE DIHYDROCHLORIDE 0.125 MG PO TABS: 0.1250 mg | ORAL_TABLET | Freq: Every day | ORAL | Status: DC

## 2013-10-12 NOTE — Progress Notes (Signed)
Pre visit review using our clinic review tool, if applicable. No additional management support is needed unless otherwise documented below in the visit note. 

## 2013-10-12 NOTE — Progress Notes (Signed)
Order placed for labs.

## 2013-10-13 ENCOUNTER — Other Ambulatory Visit: Payer: Self-pay | Admitting: Internal Medicine

## 2013-10-13 ENCOUNTER — Encounter: Payer: Self-pay | Admitting: Internal Medicine

## 2013-10-13 ENCOUNTER — Other Ambulatory Visit: Payer: Medicare Other

## 2013-10-13 DIAGNOSIS — C679 Malignant neoplasm of bladder, unspecified: Secondary | ICD-10-CM | POA: Insufficient documentation

## 2013-10-13 DIAGNOSIS — G2581 Restless legs syndrome: Secondary | ICD-10-CM | POA: Insufficient documentation

## 2013-10-13 DIAGNOSIS — D649 Anemia, unspecified: Secondary | ICD-10-CM

## 2013-10-13 DIAGNOSIS — R110 Nausea: Secondary | ICD-10-CM | POA: Insufficient documentation

## 2013-10-13 LAB — VITAMIN D 25 HYDROXY (VIT D DEFICIENCY, FRACTURES): Vit D, 25-Hydroxy: 50 ng/mL (ref 30–89)

## 2013-10-13 NOTE — Progress Notes (Signed)
Subjective:    Patient ID: Bailey Sanchez, female    DOB: Mar 24, 1926, 80 y.o.   MRN: [id number]  HPI 80 year old female with past history of hypertension, hypercholesterolemia, hypothyroidism and SIADH who comes in today as a work in with concerns regarding nausea, decreased appetite and restless legs.  She was admitted 07/28/13-08/01/13 with weakness and hyponatremia.  While in the hospital, she had hematuria.  Subsequent w/up and surgery two weeks ago.  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  She is seeing Dr Bernardo Heater.   She saw him yesterday and he discussed possible treatment options which include radical cystectomy, radiation and chemotherapy.  She is waiting for referral to oncology.  She is accompanied by her niece.  History obtained form both of them.  She is not eating well.  She states her appetite has picked up some since her surgery two weeks ago.  incresed nausea. Started zofran last night.  Still with decreased po intake.  Some weakness.  Bowels appear to be stable.  She also reports problems with restless legs.  Not sleeping.      Past Medical History  Diagnosis Date  . Hyperlipidemia   . Neuralgia and neuritis   . Hypertension   . Anemia   . GERD (gastroesophageal reflux disease)   . Hypercholesterolemia   . Allergy   . Raynauds phenomenon     documented hx  . Osteopetrosis   . COPD (chronic obstructive pulmonary disease)     documented hx of mild case  . History of SIADH   . Hypothyroidism     Current Outpatient Prescriptions on File Prior to Visit  Medication Sig Dispense Refill  . amLODipine (NORVASC) 2.5 MG tablet TAKE ONE (1) TABLET EACH DAY  90 tablet  0  . Ascorbic Acid (VITAMIN C) 1000 MG tablet Take 1,000 mg by mouth daily.      Marland Kitchen aspirin EC 81 MG tablet Take 81 mg by mouth daily.      Marland Kitchen atorvastatin (LIPITOR) 20 MG tablet TAKE ONE (1) TABLET EACH DAY  90 tablet  1  . Calcium-Vitamin D (CALTRATE 600 PLUS-VIT D PO) Take 1 tablet by mouth  daily.      . cholecalciferol (VITAMIN D) 400 UNITS TABS Take 400 Units by mouth daily.      . cyanocobalamin 1000 MCG tablet Take 1,000 mcg by mouth daily.      . fluticasone (FLONASE) 50 MCG/ACT nasal spray TWO PUFFS IN EACH NOSTRIL ONCE A DAY  16 g  2  . levothyroxine (SYNTHROID, LEVOTHROID) 25 MCG tablet TAKE ONE (1) TABLET EACH DAY  90 tablet  2  . mirabegron ER (MYRBETRIQ) 25 MG TB24 tablet Take 25 mg by mouth daily.      . Multiple Vitamins-Minerals (CENTRUM SILVER PO) Take by mouth.      . Multiple Vitamins-Minerals (PRESERVISION/LUTEIN) CAPS Take by mouth 2 (two) times daily.      . vitamin E (VITAMIN E) 400 UNIT capsule Take 400 Units by mouth daily.       No current facility-administered medications on file prior to visit.    Review of Systems Patient denies any headache or significant lightheadedness or dizziness.  Does report weakness.  No sinus or allergy symptoms.  No chest pain, tightness or palpitations.  No increased shortness of breath, cough or congestion.  No vomiting.  Does report persistent nausea.  No acid reflux.  No abdominal pain or cramping.  No bowel change, such as diarrhea, constipation,  BRBPR or melana.  Not eating well.  Restless legs as outlined.           Objective:   Physical Exam  Filed Vitals:   10/12/13 1210  BP: 118/52  Pulse: 62  Resp: 16   Blood pressure recheck:  42/33  80 year old female in no acute distress.   HEENT:  Nares- clear.  Oropharynx - without lesions. NECK:  Supple.  Nontender.  No audible bruit.  HEART:  Appears to be regular. LUNGS:  No crackles or wheezing audible.  Respirations even and unlabored.  RADIAL PULSE:  Equal bilaterally.  ABDOMEN:  Soft, nontender.  Bowel sounds present and normal.  No audible abdominal bruit. Marland Kitchen   EXTREMITIES:  No increased edema present.  DP pulses palpable and equal bilaterally.          Assessment & Plan:  PULMONARY.  Breathing stable.  Follow.   HEALTH MAINTENANCE.  Physical 10/14/12.    Declined rectal and pelvic exam.  She declines further GI evaluation.  Mammogram 07/20/10 - BiRADS II.  She has requested not to schedule any more mammograms.   I spent 40 minutes with the patient and more than 50% of the time was spent in consultation regarding the above.

## 2013-10-13 NOTE — Assessment & Plan Note (Signed)
Keeping her awake.  Tried requip in the hospital.  Helped initially.  States "then stopped working".  Will check cbc and iron studies.  Start mirapex.  Follow.

## 2013-10-13 NOTE — Progress Notes (Signed)
Order placed for ferritin and ibc panel.

## 2013-10-13 NOTE — Assessment & Plan Note (Signed)
Seeing Dr Bernardo Heater.  Being referred to oncology for further evaluation and treatment.

## 2013-10-13 NOTE — Assessment & Plan Note (Signed)
Blood pressure as outlined.  Stop amlodipine.

## 2013-10-13 NOTE — Assessment & Plan Note (Signed)
On thyroid replacement.  Follow tsh.  Recheck today.    

## 2013-10-13 NOTE — Assessment & Plan Note (Signed)
Persistent.  Has zofran now.  Encourage increased po intake.  Follow.

## 2013-10-13 NOTE — Assessment & Plan Note (Signed)
Last hgb checked here, wnl.  With the increased hematuria and restless legs, will check cbc and iron stores.

## 2013-10-13 NOTE — Assessment & Plan Note (Signed)
Concern with the decreased appetite, decreased po intake and persistent nausea that her sodium may be low.  Check electrolytes.

## 2013-10-14 ENCOUNTER — Telehealth: Payer: Self-pay | Admitting: *Deleted

## 2013-10-14 ENCOUNTER — Other Ambulatory Visit (INDEPENDENT_AMBULATORY_CARE_PROVIDER_SITE_OTHER): Payer: Medicare Other

## 2013-10-14 DIAGNOSIS — E7800 Pure hypercholesterolemia, unspecified: Secondary | ICD-10-CM

## 2013-10-14 DIAGNOSIS — D649 Anemia, unspecified: Secondary | ICD-10-CM

## 2013-10-14 LAB — FERRITIN: Ferritin: 90.7 ng/mL (ref 10.0–291.0)

## 2013-10-14 LAB — IBC PANEL
Iron: 88 ug/dL (ref 42–145)
Saturation Ratios: 27.5 % (ref 20.0–50.0)
Transferrin: 228.4 mg/dL (ref 212.0–360.0)

## 2013-10-14 NOTE — Telephone Encounter (Signed)
Spoke with Santiago Glad, she is doing much better (seems stronger), She ate a good lunch & dinner yesterday. Scheduled lab appt also.

## 2013-10-14 NOTE — Telephone Encounter (Signed)
Ok, but if problems - let us know.

## 2013-10-14 NOTE — Telephone Encounter (Signed)
Please confirm if she is doing ok.  Is she eating better?  Will need to have a f/u sodium soon (either tomorrow or Monday).  Thanks.  Will review records.

## 2013-10-14 NOTE — Telephone Encounter (Signed)
Received a voicemail from niece Santiago Glad) with an update from Ms. Leory Plowman. She took her to the ER the night of 6/2 as recommended. She was admitted & released on 6/3. Her Sodium level increased to 129. (Records rquested)

## 2013-10-14 NOTE — Telephone Encounter (Signed)
Patient has an appt for a CPE on 10/19/13 @ 8:30am, will it be okay to wait until that appt to recheck Sodium?

## 2013-10-14 NOTE — Telephone Encounter (Signed)
Duplicate.  See previous message.   

## 2013-10-14 NOTE — Telephone Encounter (Signed)
noted 

## 2013-10-14 NOTE — Telephone Encounter (Signed)
LMTCB/lsw

## 2013-10-15 ENCOUNTER — Encounter: Payer: Self-pay | Admitting: *Deleted

## 2013-10-19 ENCOUNTER — Encounter: Payer: Self-pay | Admitting: Internal Medicine

## 2013-10-19 ENCOUNTER — Ambulatory Visit (INDEPENDENT_AMBULATORY_CARE_PROVIDER_SITE_OTHER): Payer: Medicare Other | Admitting: Internal Medicine

## 2013-10-19 VITALS — BP 110/50 | HR 73 | Temp 98.2°F | Ht 60.0 in | Wt 96.2 lb

## 2013-10-19 DIAGNOSIS — I10 Essential (primary) hypertension: Secondary | ICD-10-CM

## 2013-10-19 DIAGNOSIS — E871 Hypo-osmolality and hyponatremia: Secondary | ICD-10-CM

## 2013-10-19 DIAGNOSIS — D649 Anemia, unspecified: Secondary | ICD-10-CM

## 2013-10-19 DIAGNOSIS — C679 Malignant neoplasm of bladder, unspecified: Secondary | ICD-10-CM

## 2013-10-19 DIAGNOSIS — R110 Nausea: Secondary | ICD-10-CM

## 2013-10-19 DIAGNOSIS — G2581 Restless legs syndrome: Secondary | ICD-10-CM

## 2013-10-19 LAB — CBC WITH DIFFERENTIAL/PLATELET
BASOS PCT: 0.8 % (ref 0.0–3.0)
Basophils Absolute: 0.1 10*3/uL (ref 0.0–0.1)
EOS PCT: 5.1 % — AB (ref 0.0–5.0)
Eosinophils Absolute: 0.4 10*3/uL (ref 0.0–0.7)
HEMATOCRIT: 29.1 % — AB (ref 36.0–46.0)
Hemoglobin: 9.4 g/dL — ABNORMAL LOW (ref 12.0–15.0)
LYMPHS ABS: 2.6 10*3/uL (ref 0.7–4.0)
Lymphocytes Relative: 29.3 % (ref 12.0–46.0)
MCHC: 32.5 g/dL (ref 30.0–36.0)
MCV: 87.5 fl (ref 78.0–100.0)
MONO ABS: 1.3 10*3/uL — AB (ref 0.1–1.0)
Monocytes Relative: 14.4 % — ABNORMAL HIGH (ref 3.0–12.0)
Neutro Abs: 4.5 10*3/uL (ref 1.4–7.7)
Neutrophils Relative %: 50.4 % (ref 43.0–77.0)
Platelets: 487 10*3/uL — ABNORMAL HIGH (ref 150.0–400.0)
RBC: 3.33 Mil/uL — AB (ref 3.87–5.11)
RDW: 15.8 % — ABNORMAL HIGH (ref 11.5–15.5)
WBC: 8.8 10*3/uL (ref 4.0–10.5)

## 2013-10-19 LAB — BASIC METABOLIC PANEL
BUN: 12 mg/dL (ref 6–23)
CALCIUM: 9.8 mg/dL (ref 8.4–10.5)
CO2: 27 mEq/L (ref 19–32)
Chloride: 99 mEq/L (ref 96–112)
Creatinine, Ser: 0.7 mg/dL (ref 0.4–1.2)
GFR: 87.11 mL/min (ref >60.00)
GLUCOSE: 82 mg/dL (ref 70–99)
Potassium: 4.6 mEq/L (ref 3.5–5.1)
Sodium: 132 mEq/L — ABNORMAL LOW (ref 135–145)

## 2013-10-19 NOTE — Progress Notes (Signed)
Pre visit review using our clinic review tool, if applicable. No additional management support is needed unless otherwise documented below in the visit note. 

## 2013-10-22 ENCOUNTER — Ambulatory Visit: Payer: Medicare Other | Admitting: Internal Medicine

## 2013-10-24 ENCOUNTER — Encounter: Payer: Self-pay | Admitting: Internal Medicine

## 2013-10-24 NOTE — Assessment & Plan Note (Signed)
Resolved.  Not requiring mirapex.  Follow.

## 2013-10-24 NOTE — Assessment & Plan Note (Signed)
Blood pressure as outlined.  Remain off amlodipine.  Follow.

## 2013-10-24 NOTE — Assessment & Plan Note (Signed)
Hgb low.  (low 9 range in the hospital).  Recheck today to confirm stable.  Due to see oncology next week.  Follow.

## 2013-10-24 NOTE — Assessment & Plan Note (Signed)
Admitted recently with decreased sodium - 120.  Hydrated with normal saline.  She is eating better now.  Recheck sodium today.  Follow.

## 2013-10-24 NOTE — Assessment & Plan Note (Signed)
Seeing Dr Bernardo Heater.  Being referred to oncology for further evaluation and treatment.  Due to see Dr Marella Chimes (urology/oncology) and Dr Scharlene Corn (medical oncology) next week.  Will discuss further treatment options.

## 2013-10-24 NOTE — Progress Notes (Signed)
Subjective:    Patient ID: Bailey Sanchez, female    DOB: 10-Jan-1926, 80 y.o.   MRN: [id number]  HPI 80 year old female with past history of hypertension, hypercholesterolemia, hypothyroidism and SIADH who comes in today for a hospital follow up.  She was admitted 10/12/13-10/13/13 for hyponatremia.  She was hydrated with normal saline.  hgb decreased.  Sodium improved to 129 just prior to discharge.  Prior to this,  she was admitted 07/28/13-08/01/13 with weakness and hyponatremia.  While in the hospital, she had hematuria.  Subsequent w/up and surgery three weeks ago.  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  She is seeing Dr Bernardo Heater.   She is planning for f/u with Dr Sherryl Manges (urology/oncology) and Dr Scharlene Corn (medical oncology) - 10/28/13.   They are planning to review and discuss treatment options.   She is accompanied by her niece and her sister.   History obtained form all of them.  She is eating better.   She states her appetite has picked up.  No nausea.  Bowels appear to be stable.  Restless legs are not a significant issue for her now.  Overall feels better.  Getting around better.       Past Medical History  Diagnosis Date  . Hyperlipidemia   . Neuralgia and neuritis   . Hypertension   . Anemia   . GERD (gastroesophageal reflux disease)   . Hypercholesterolemia   . Allergy   . Raynauds phenomenon     documented hx  . Osteopetrosis   . COPD (chronic obstructive pulmonary disease)     documented hx of mild case  . History of SIADH   . Hypothyroidism     Current Outpatient Prescriptions on File Prior to Visit  Medication Sig Dispense Refill  . Ascorbic Acid (VITAMIN C) 1000 MG tablet Take 1,000 mg by mouth daily.      Marland Kitchen atorvastatin (LIPITOR) 20 MG tablet TAKE ONE (1) TABLET EACH DAY  90 tablet  1  . Calcium-Vitamin D (CALTRATE 600 PLUS-VIT D PO) Take 1 tablet by mouth daily.      . cholecalciferol (VITAMIN D) 400 UNITS TABS Take 400 Units by mouth  daily.      . cyanocobalamin 1000 MCG tablet Take 1,000 mcg by mouth daily.      . fluticasone (FLONASE) 50 MCG/ACT nasal spray TWO PUFFS IN EACH NOSTRIL ONCE A DAY  16 g  2  . levothyroxine (SYNTHROID, LEVOTHROID) 25 MCG tablet TAKE ONE (1) TABLET EACH DAY  90 tablet  2  . mirabegron ER (MYRBETRIQ) 25 MG TB24 tablet Take 25 mg by mouth daily.      . Multiple Vitamins-Minerals (CENTRUM SILVER PO) Take by mouth.      . Multiple Vitamins-Minerals (PRESERVISION/LUTEIN) CAPS Take by mouth 2 (two) times daily.      . ondansetron (ZOFRAN) 4 MG tablet Take 4 mg by mouth every 8 (eight) hours as needed for nausea or vomiting.      . pramipexole (MIRAPEX) 0.125 MG tablet Take 1 tablet (0.125 mg total) by mouth at bedtime.  30 tablet  1  . vitamin E (VITAMIN E) 400 UNIT capsule Take 400 Units by mouth daily.      Marland Kitchen amLODipine (NORVASC) 2.5 MG tablet TAKE ONE (1) TABLET EACH DAY  90 tablet  0  . aspirin EC 81 MG tablet Take 81 mg by mouth daily.       No current facility-administered medications on file prior to  visit.    Review of Systems Patient denies any headache or significant lightheadedness or dizziness.  Does report energy better.  No sinus or allergy symptoms.  No chest pain, tightness or palpitations.  No increased shortness of breath, cough or congestion.  No vomiting.  Nausea has resolved.   No acid reflux.  No abdominal pain or cramping.  No bowel change, such as diarrhea, constipation, BRBPR or melana.  Eating better.  Restless legs better.   Sodium better.  No further hematuria.  Hgb low.         Objective:   Physical Exam  Filed Vitals:   10/19/13 0843  BP: 110/50  Pulse: 73  Temp: 98.2 F (15.60 C)   80 year old female in no acute distress.   HEENT:  Nares- clear.  Oropharynx - without lesions. NECK:  Supple.  Nontender.  No audible bruit.  HEART:  Appears to be regular. LUNGS:  No crackles or wheezing audible.  Respirations even and unlabored.  RADIAL PULSE:  Equal  bilaterally.  ABDOMEN:  Soft, nontender.  Bowel sounds present and normal.  No audible abdominal bruit. Marland Kitchen   EXTREMITIES:  No increased edema present.  DP pulses palpable and equal bilaterally.          Assessment & Plan:  PULMONARY.  Breathing stable.  Follow.   HEALTH MAINTENANCE.  Physical 10/14/12.   Declined rectal and pelvic exam.  She declines further GI evaluation.  Mammogram 07/20/10 - BiRADS II.  She has requested not to schedule any more mammograms.   I spent 25 minutes with the patient and more than 50% of the time was spent in consultation regarding the above.

## 2013-10-24 NOTE — Assessment & Plan Note (Signed)
Resolved.  Drinking boost now.  Follow.

## 2013-11-20 ENCOUNTER — Other Ambulatory Visit: Payer: Self-pay | Admitting: Internal Medicine

## 2013-11-23 ENCOUNTER — Encounter: Payer: Self-pay | Admitting: Internal Medicine

## 2014-01-20 ENCOUNTER — Other Ambulatory Visit: Payer: Self-pay | Admitting: Internal Medicine

## 2014-03-13 DEATH — deceased

## 2016-04-25 IMAGING — CR DG CHEST 1V PORT
1 series · 1 of 1 positions shown · non-contrast
Comparison: Chest CT from 11/22/2013.  Chest x-ray from 07/28/2013.

CLINICAL DATA: Bladder cancer. Nausea and vomiting. Altered mental
status.

EXAM:
PORTABLE CHEST - 1 VIEW

[ap]
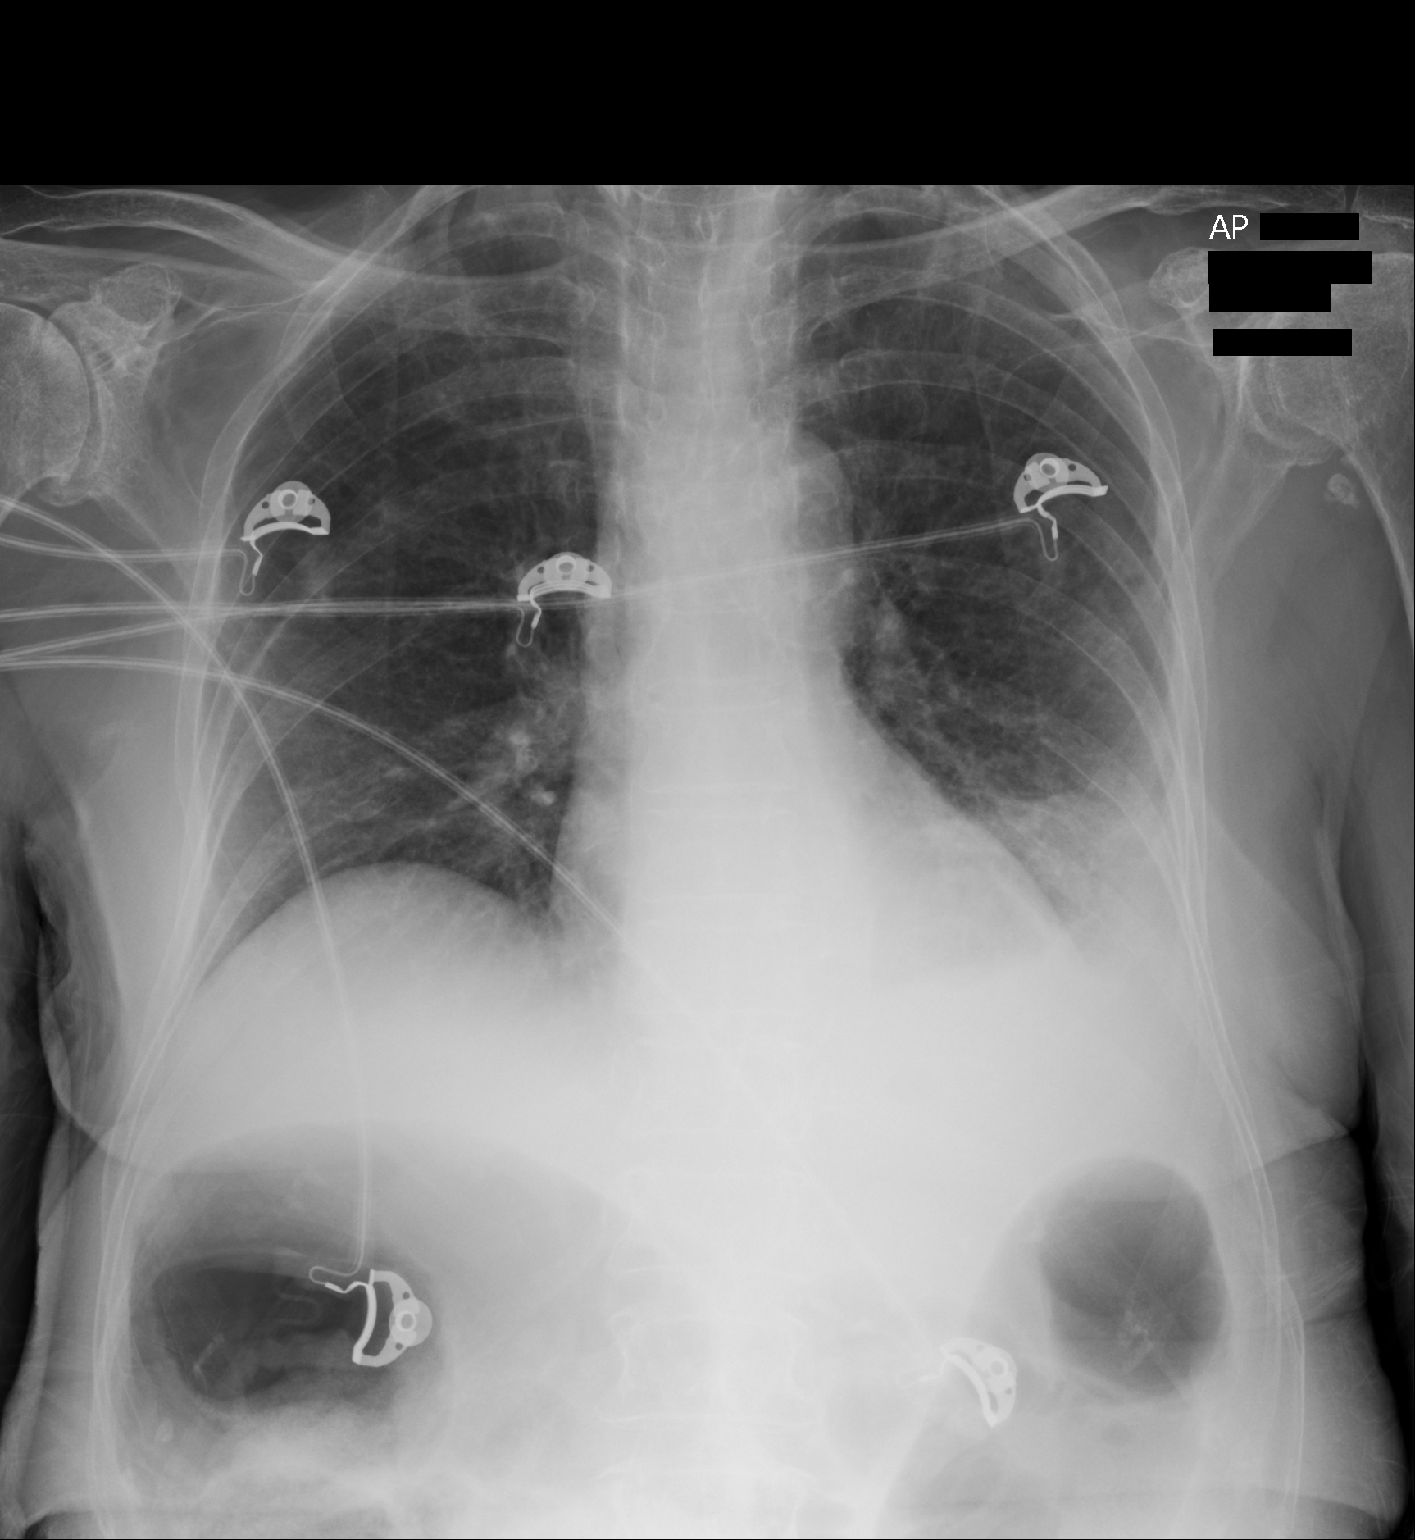

[1 of 1 positions shown; findings below may reference images not displayed]

FINDINGS: 3860 hrs. Interval development of atelectasis or pneumonia at the
left base. Interstitial markings are diffusely coarsened with
chronic features. Nodular opacity is seen in the right mid lung. The
cardiopericardial silhouette is within normal limits for size.
Degenerative changes are noted in both shoulders. Bones are
diffusely demineralized. Telemetry leads overlie the chest. Gaseous
distention of bowel noted in the upper abdomen.
IMPRESSION: New atelectasis or pneumonia at the left base with probable small
left pleural effusion.

Pulmonary nodule noted in the right mid lung. Recent chest CT
documented bilateral pulmonary nodules.
